# Patient Record
Sex: Female | Born: 1995 | Race: Black or African American | Hispanic: No | Marital: Single | Smoking: Never smoker
Health system: Southern US, Community
[De-identification: ages and names within clinical notes are randomized; demographics above are authoritative.]

## PROBLEM LIST (undated history)

## (undated) DIAGNOSIS — F419 Anxiety disorder, unspecified: Secondary | ICD-10-CM

---
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
## 2017-08-26 IMAGING — CR DG KNEE COMPLETE 4+V*R*
4 series · 4 of 4 positions shown · non-contrast
Comparison: None.

CLINICAL DATA: Injury with generalized right knee pain

EXAM:
RIGHT KNEE - COMPLETE 4+ VIEW

[t knee ap right]
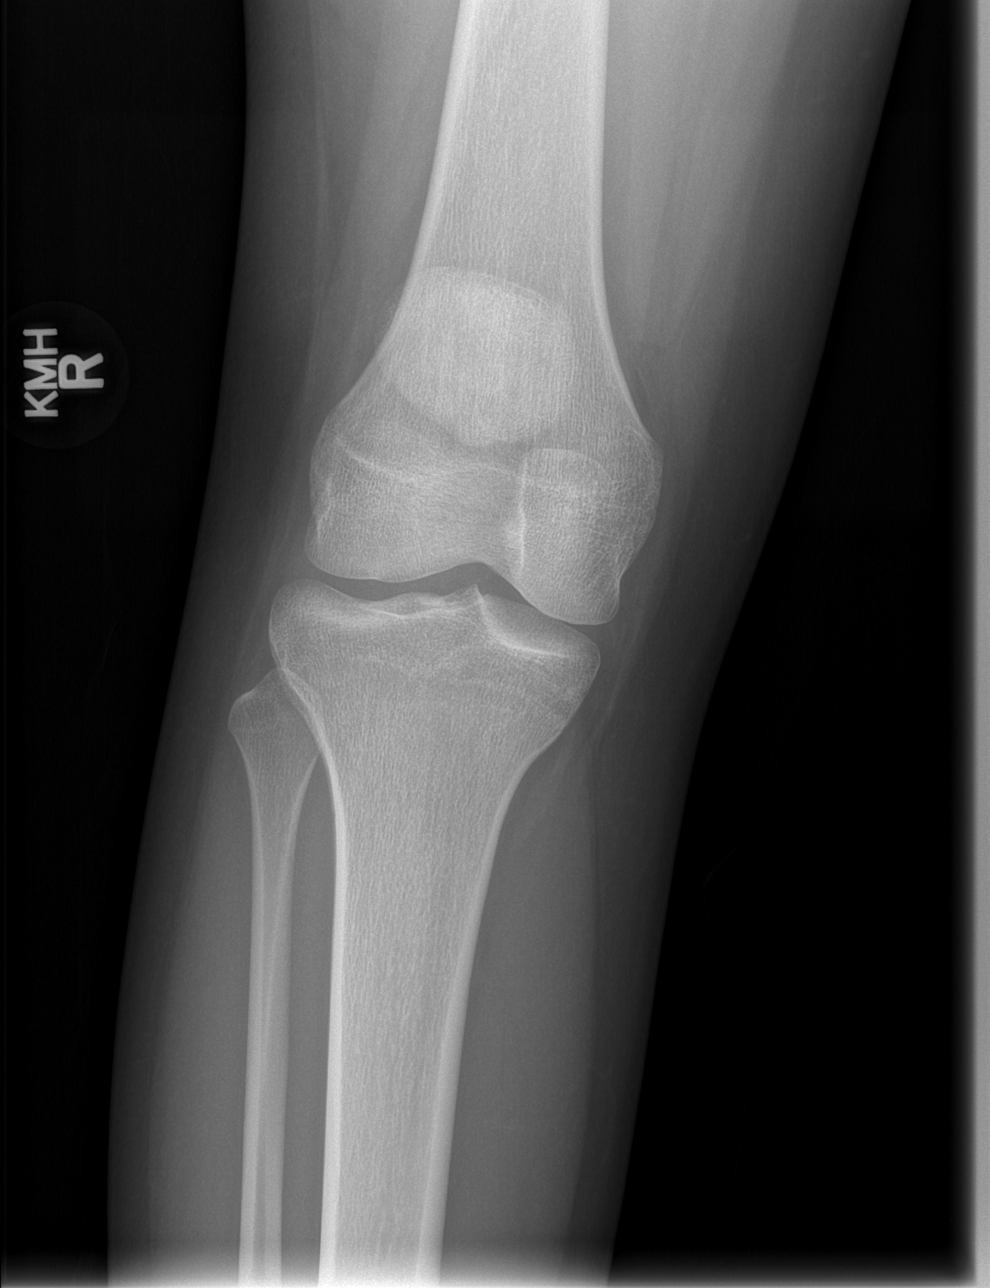

[t knee obl right (1 of 2)]
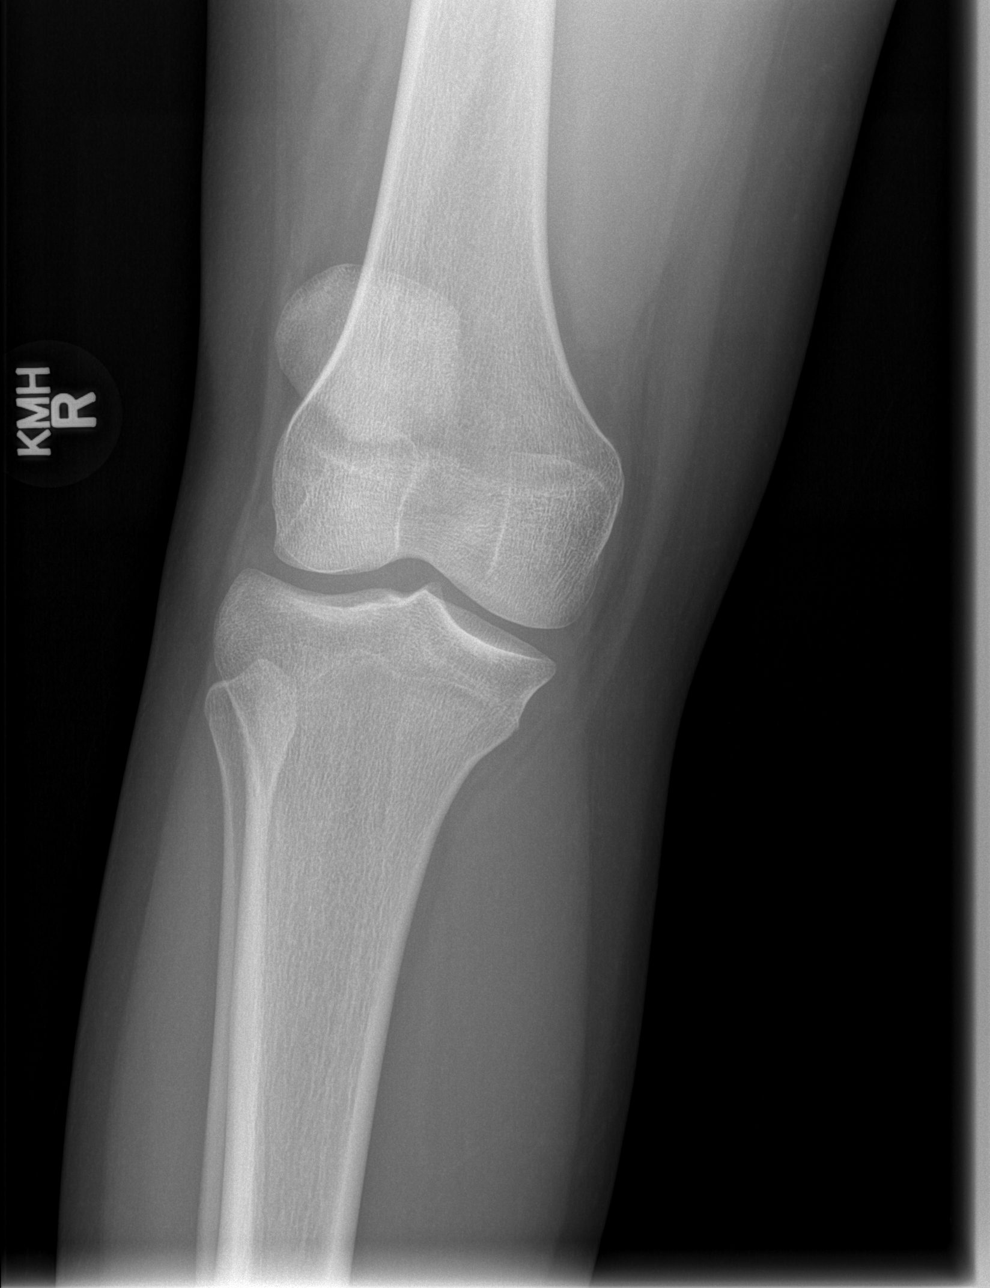

[t knee obl right (2 of 2)]
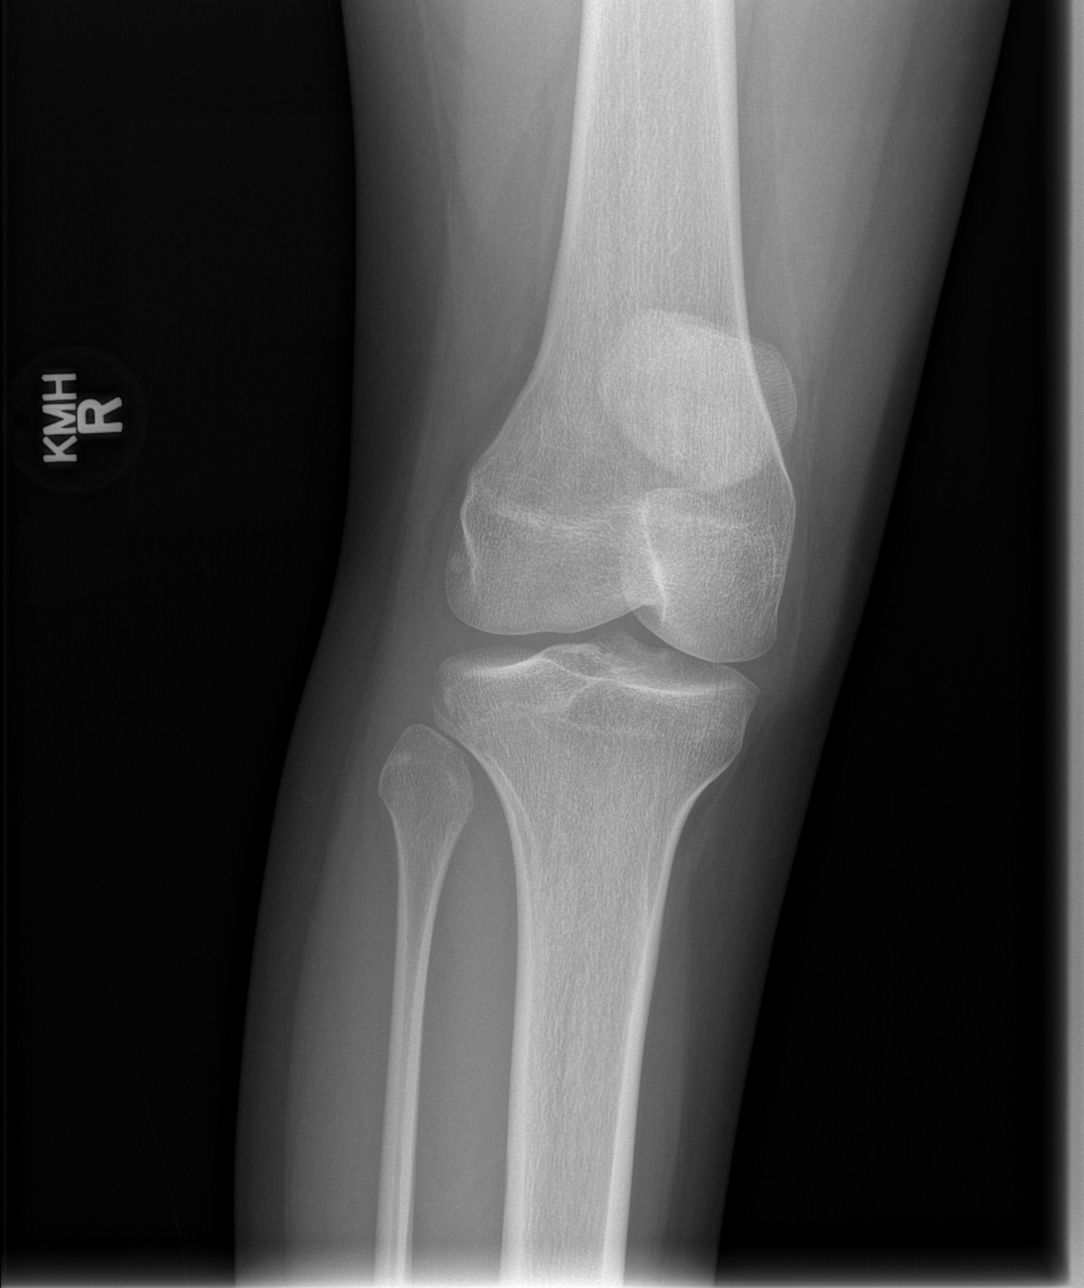

[t knee lat right]
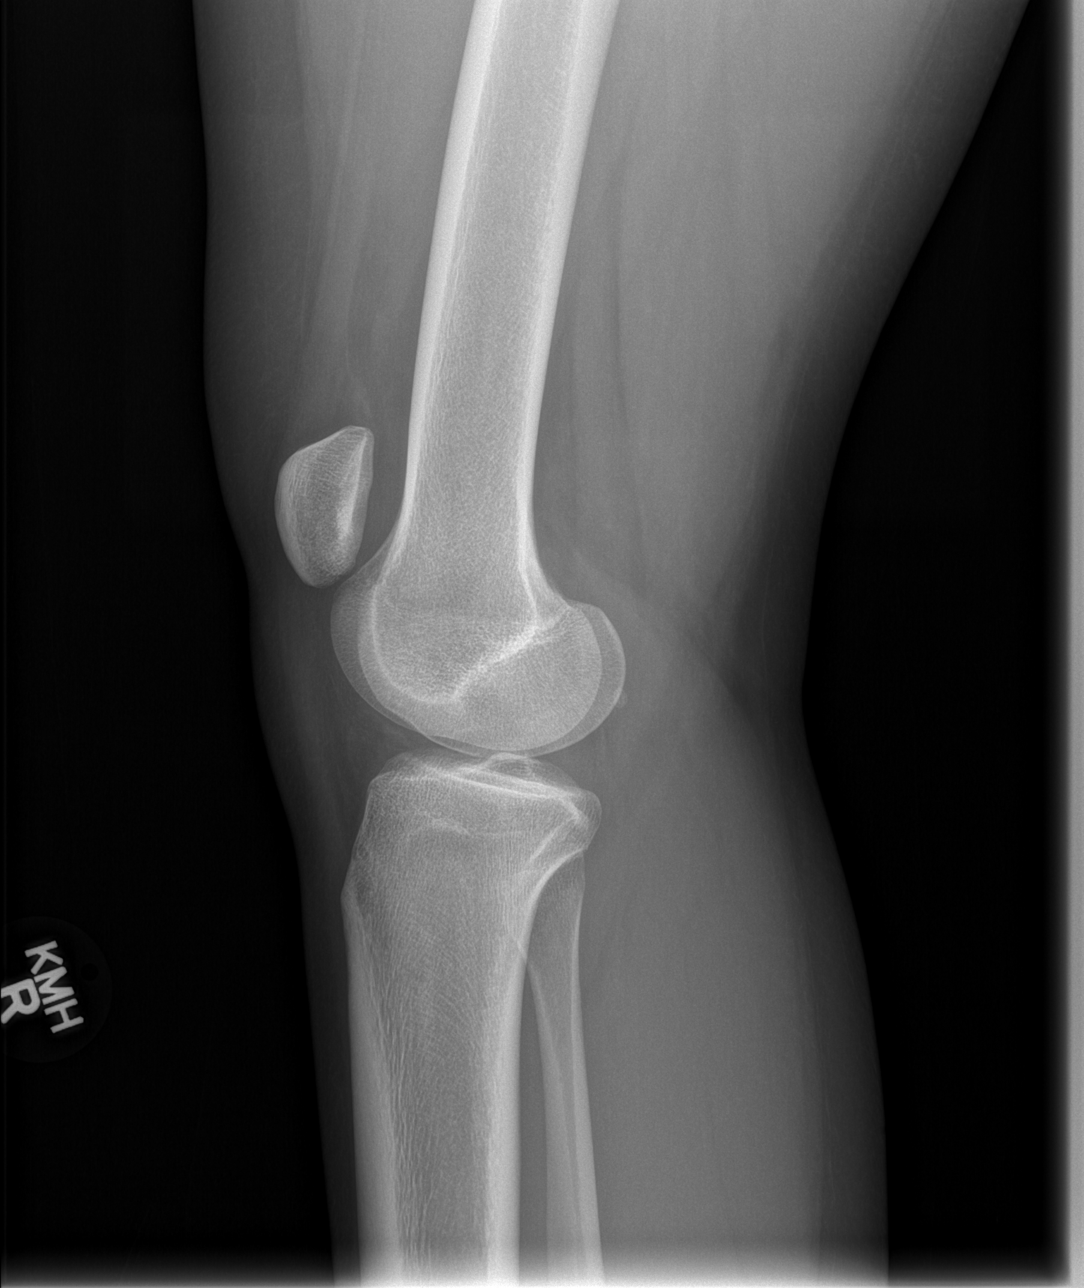

[4 of 4 positions shown; findings below may reference images not displayed]

FINDINGS: No fracture is visualized. High riding appearance of the patella.
Joint space compartments are maintained. No significant effusion.
IMPRESSION: No acute osseous abnormality.  Mildly high riding patella

## 2018-05-26 ENCOUNTER — Encounter (HOSPITAL_COMMUNITY): Payer: Self-pay | Admitting: Pharmacy Technician

## 2018-05-26 ENCOUNTER — Emergency Department (HOSPITAL_COMMUNITY)
Admission: EM | Admit: 2018-05-26 | Discharge: 2018-05-27 | Disposition: A | Discharge status: Home / Self Care | Payer: Self-pay | Attending: Emergency Medicine | Admitting: Emergency Medicine

## 2018-05-26 ENCOUNTER — Other Ambulatory Visit: Payer: Self-pay

## 2018-05-26 DIAGNOSIS — F4322 Adjustment disorder with anxiety: Secondary | ICD-10-CM | POA: Insufficient documentation

## 2018-05-26 DIAGNOSIS — Z79899 Other long term (current) drug therapy: Secondary | ICD-10-CM | POA: Insufficient documentation

## 2018-05-26 DIAGNOSIS — L739 Follicular disorder, unspecified: Secondary | ICD-10-CM | POA: Insufficient documentation

## 2018-05-26 NOTE — ED Triage Notes (Signed)
Pt reports symptoms of "herpes". Reports 'fevers, flare ups, and chills". When asked about vaginal pain pt states "some".

## 2018-05-26 NOTE — ED Notes (Signed)
Sort Nurse Note. Pt arrives with her mother, pt mother says the pt has been very paranoid and anxious, she is concerned about her mental health. The mother, Basil Dess can be reached at [phone] with disposition and questions.

## 2018-05-26 NOTE — ED Provider Notes (Signed)
[facility] EMERGENCY DEPARTMENT Provider Note   CSN: [id number] Arrival date & time: 05/26/18  2154    History   Chief Complaint Chief Complaint  Patient presents with   Fever    HPI Isabel Peterson is a 23 y.o. female.    23 year old female with a history of anxiety and depressive disorder presents to the emergency department over concern for genital herpes.  States that she has been sexually active with 4 partners in the past 6 months.  Recently engaged in unprotected sex with her current boyfriend.  She has noted bumps in her vaginal area which are uncomfortable.  She has not noted any drainage to the area nor has she taken any medications for her symptoms.  In triage, patient referencing "fevers, flare-ups, and chills".  Denies ever previously having genital herpes.  LMP 1 month ago.  Her mother expressed concern for increased anxiety and paranoia.  Patient states that she has been anxious lately, mostly surrounding the coronavirus.  Endorses travel to [place] to see her boyfriend 2 weeks ago.  Notes that 1 of his parents tested positive for corona virus, but the other tested negative.  Reports previously documented fever of 103F, but cannot recall what day she had this fever.  Denies having a fever today.  She currently has no complaints of body aches, congestion, cough, vomiting, diarrhea.  Denies any other sick contacts.  Has sporadically been followed by a therapist/psychiatrist most recently 1 week ago.  Denies any suicidal thoughts, homicidal thoughts, auditory or visual hallucinations.  The history is provided by the patient. No language interpreter was used.  Fever    Past Medical History:  Diagnosis Date   Anxiety     Patient Active Problem List   Diagnosis Date Noted   Major depressive disorder, recurrent severe without psychotic features (HCC) 04/27/2016    History reviewed. No pertinent surgical history.   OB History   No obstetric history  on file.      Home Medications    Prior to Admission medications   Medication Sig Start Date End Date Taking? Authorizing Provider  escitalopram (LEXAPRO) 10 MG tablet Take 1 tablet (10 mg total) by mouth daily. 05/02/16   Adonis Brook, NP  hydrOXYzine (ATARAX/VISTARIL) 25 MG tablet Take 1 tablet (25 mg total) by mouth every 6 (six) hours as needed for anxiety. 05/01/16   Adonis Brook, NP  traZODone (DESYREL) 100 MG tablet Take 1 tablet (100 mg total) by mouth at bedtime as needed for sleep. 05/01/16   Adonis Brook, NP    Family History No family history on file.  Social History Social History   Tobacco Use   Smoking status: Never Smoker   Smokeless tobacco: Never Used  Substance Use Topics   Alcohol use: No   Drug use: No     Allergies   Patient has no known allergies.   Review of Systems Review of Systems  Constitutional: Positive for fever.  Ten systems reviewed and are negative for acute change, except as noted in the HPI.    Physical Exam Updated Vital Signs BP (!) 127/93    Pulse (!) 108    Temp 98.6 F (37 C) (Oral)    Resp 16    Ht 5\' 4"  (1.626 m)    Wt 61.2 kg    SpO2 100%    BMI 23.17 kg/m   Physical Exam Vitals signs and nursing note reviewed.  Constitutional:      General: She is  not in acute distress.    Appearance: She is well-developed. She is not diaphoretic.     Comments: Nontoxic appearing, in NAD  HENT:     Head: Normocephalic and atraumatic.  Eyes:     General: No scleral icterus.    Conjunctiva/sclera: Conjunctivae normal.  Neck:     Musculoskeletal: Normal range of motion.  Cardiovascular:     Rate and Rhythm: Normal rate and regular rhythm.     Pulses: Normal pulses.  Pulmonary:     Effort: Pulmonary effort is normal. No respiratory distress.     Breath sounds: No stridor. No wheezing, rhonchi or rales.     Comments: No cough. Lungs CTAB. Respirations even and unlabored. Genitourinary:    Labia:        Right: No  tenderness or injury.        Left: No tenderness or injury.      Vagina: Vaginal discharge present.     Cervix: No cervical motion tenderness, lesion or cervical bleeding.     Comments: Cluster of 5 papules associated with hair follicles to the lower portion of the right labia majora. No vesicles, purulence, drainage. White vaginal discharge noted at the vaginal introitus.  Musculoskeletal: Normal range of motion.  Skin:    General: Skin is warm and dry.     Coloration: Skin is not pale.     Findings: No erythema or rash.  Neurological:     Mental Status: She is alert and oriented to person, place, and time.     Coordination: Coordination normal.  Psychiatric:        Mood and Affect: Mood is anxious.        Behavior: Behavior is withdrawn. Behavior is cooperative.      ED Treatments / Results  Labs (all labs ordered are listed, but only abnormal results are displayed) Labs Reviewed  WET PREP, GENITAL - Abnormal; Notable for the following components:      Result Value   Clue Cells Wet Prep HPF POC PRESENT (*)    WBC, Wet Prep HPF POC MODERATE (*)    All other components within normal limits  URINALYSIS, ROUTINE W REFLEX MICROSCOPIC - Abnormal; Notable for the following components:   Color, Urine AMBER (*)    APPearance CLOUDY (*)    Specific Gravity, Urine 1.031 (*)    Ketones, ur 20 (*)    Protein, ur 100 (*)    Bacteria, UA FEW (*)    All other components within normal limits  PREGNANCY, URINE  HSV 1 ANTIBODY, IGG  HSV 2 ANTIBODY, IGG  GC/CHLAMYDIA PROBE AMP ([place]) NOT AT [facility]    EKG None  Radiology No results found.  Procedures Procedures (including critical care time)  Medications Ordered in ED Medications - No data to display   Initial Impression / Assessment and Plan / ED Course  I have reviewed the triage vital signs and the nursing notes.  Pertinent labs & imaging results that were available during my care of the patient were reviewed by me and  considered in my medical decision making (see chart for details).        23 year old female presenting to the ED for complaint of genital bumps.  Physical exam consistent with folliculitis secondary to shaving as papules are associated with individual hair follicles.  She has no evidence of vesicles.  Low suspicion for genital herpes, though she did undergo full STD panel which is currently pending.  Patient also expressing thoughts of  increased anxiety and paranoia surrounding coronavirus.  Traveled to [place] 2 weeks ago and reports that her boyfriend's parent tested positive.  Endorses remote history of fever, but is unsure of when this fever was.  She has not been febrile in the past 24 hours and denies any associated symptoms including body aches, congestion, cough, nausea, vomiting.  Clinically, she appears well.    In light of the current and rapidly developing COVID-19 pandemic, I have attempted to carefully consider the risks and benefits of prolonged ED workups and/or hospitalization versus patient's risk of acquiring or transmitting SARS-CoV-2.  I have made reasonable efforts to conserve healthcare resources and defer to safe outpatient alternatives when practical.  I have also discussed the importance of social distancing and proper hygiene to the patient and/or caretakers.  With respect to her angst and paranoia, the patient denies SI/HI and is not reacting to internal stimuli.  I do not believe her to be a harm to herself or others.  She will be discharge with plan to travel home to [place] with her mother.  Return precautions discussed and provided. Patient discharged in stable condition with no unaddressed concerns.   Final Clinical Impressions(s) / ED Diagnoses   Final diagnoses:  Folliculitis  Adjustment disorder with anxious mood    ED Discharge Orders    None       Antony Madura, PA-C 05/27/18 0217    Gwyneth Sprout, MD 05/29/18 1018

## 2018-05-27 LAB — PREGNANCY, URINE: PREG TEST UR: NEGATIVE

## 2018-05-27 LAB — URINALYSIS, ROUTINE W REFLEX MICROSCOPIC
Bilirubin Urine: NEGATIVE
Glucose, UA: NEGATIVE mg/dL
Hgb urine dipstick: NEGATIVE
Ketones, ur: 20 mg/dL — AB
Leukocytes,Ua: NEGATIVE
Nitrite: NEGATIVE
Protein, ur: 100 mg/dL — AB
Specific Gravity, Urine: 1.031 — ABNORMAL HIGH (ref 1.005–1.030)
pH: 5 (ref 5.0–8.0)

## 2018-05-27 LAB — GC/CHLAMYDIA PROBE AMP (~~LOC~~) NOT AT ARMC
Chlamydia: NEGATIVE
Neisseria Gonorrhea: NEGATIVE

## 2018-05-27 LAB — WET PREP, GENITAL
Sperm: NONE SEEN
TRICH WET PREP: NONE SEEN
Yeast Wet Prep HPF POC: NONE SEEN

## 2018-05-27 NOTE — Discharge Instructions (Signed)
Your genital bumps appear consistent with folliculitis from shaving. You do have STD tests and a herpes screen pending. Follow-up with the health department in 48 hours for the results of your STD tests. If you test positive for STDs, notify all sexual partners of their need to be tested and treated as well. Do not engage in sexual intercourse for one week after STD treatment, if treatment is indicated. Use a condom when sexually active.  We also recommend follow-up with a primary care doctor and/your therapist or psychiatrist regarding your increased anxiety.  You may return to the ED for new or concerning symptoms.

## 2018-05-28 LAB — HSV 2 ANTIBODY, IGG

## 2018-05-28 LAB — HSV 1 ANTIBODY, IGG: HSV 1 Glycoprotein G Ab, IgG: 31.1 index — ABNORMAL HIGH (ref 0.00–0.90)

## 2018-12-02 ENCOUNTER — Other Ambulatory Visit: Payer: Self-pay

## 2018-12-02 ENCOUNTER — Emergency Department (HOSPITAL_COMMUNITY)
Admission: EM | Admit: 2018-12-02 | Discharge: 2018-12-03 | Disposition: A | Discharge status: Home / Self Care | Payer: BLUE CROSS/BLUE SHIELD | Attending: Emergency Medicine | Admitting: Emergency Medicine

## 2018-12-02 ENCOUNTER — Encounter (HOSPITAL_COMMUNITY): Payer: Self-pay

## 2018-12-02 DIAGNOSIS — N939 Abnormal uterine and vaginal bleeding, unspecified: Secondary | ICD-10-CM | POA: Diagnosis not present

## 2018-12-02 DIAGNOSIS — R102 Pelvic and perineal pain: Secondary | ICD-10-CM | POA: Insufficient documentation

## 2018-12-02 LAB — URINALYSIS, ROUTINE W REFLEX MICROSCOPIC
Bilirubin Urine: NEGATIVE
Glucose, UA: NEGATIVE mg/dL
Ketones, ur: 20 mg/dL — AB
Nitrite: NEGATIVE
Protein, ur: 30 mg/dL — AB
Specific Gravity, Urine: 1.029 (ref 1.005–1.030)
pH: 5 (ref 5.0–8.0)

## 2018-12-02 LAB — CBC
HCT: 40.5 % (ref 36.0–46.0)
Hemoglobin: 13.5 g/dL (ref 12.0–15.0)
MCH: 29.2 pg (ref 26.0–34.0)
MCHC: 33.3 g/dL (ref 30.0–36.0)
MCV: 87.7 fL (ref 80.0–100.0)
Platelets: 347 10*3/uL (ref 150–400)
RBC: 4.62 MIL/uL (ref 3.87–5.11)
RDW: 12.6 % (ref 11.5–15.5)
WBC: 4.7 10*3/uL (ref 4.0–10.5)
nRBC: 0 % (ref 0.0–0.2)

## 2018-12-02 LAB — COMPREHENSIVE METABOLIC PANEL
ALT: 21 U/L (ref 0–44)
AST: 18 U/L (ref 15–41)
Albumin: 4.4 g/dL (ref 3.5–5.0)
Alkaline Phosphatase: 50 U/L (ref 38–126)
Anion gap: 10 (ref 5–15)
BUN: 8 mg/dL (ref 6–20)
CO2: 21 mmol/L — ABNORMAL LOW (ref 22–32)
Calcium: 9.6 mg/dL (ref 8.9–10.3)
Chloride: 107 mmol/L (ref 98–111)
Creatinine, Ser: 0.77 mg/dL (ref 0.44–1.00)
GFR calc Af Amer: >60 mL/min (ref >60)
GFR calc non Af Amer: >60 mL/min (ref >60)
Glucose, Bld: 102 mg/dL — ABNORMAL HIGH (ref 70–99)
Potassium: 3.5 mmol/L (ref 3.5–5.1)
Sodium: 138 mmol/L (ref 135–145)
Total Bilirubin: 0.7 mg/dL (ref 0.3–1.2)
Total Protein: 7.7 g/dL (ref 6.5–8.1)

## 2018-12-02 LAB — TYPE AND SCREEN
ABO/RH(D): O POS
Antibody Screen: NEGATIVE

## 2018-12-02 LAB — LIPASE, BLOOD: Lipase: 35 U/L (ref 11–51)

## 2018-12-02 LAB — I-STAT BETA HCG BLOOD, ED (MC, WL, AP ONLY): I-stat hCG, quantitative: <5 m[IU]/mL (ref <5)

## 2018-12-02 LAB — ABO/RH: ABO/RH(D): O POS

## 2018-12-02 MED ORDER — SODIUM CHLORIDE 0.9% FLUSH: 3.0000 mL | Freq: Once | INTRAVENOUS | Status: DC

## 2018-12-02 NOTE — ED Triage Notes (Signed)
Pt reports heavy vaginal bleeding since yesterday along with lower abd cramping. Changed her pad 3 times today

## 2018-12-03 LAB — WET PREP, GENITAL
Clue Cells Wet Prep HPF POC: NONE SEEN
Sperm: NONE SEEN
Trich, Wet Prep: NONE SEEN
Yeast Wet Prep HPF POC: NONE SEEN

## 2018-12-03 MED ORDER — IBUPROFEN 600 MG PO TABS: 600.0000 mg | ORAL_TABLET | Freq: Four times a day (QID) | ORAL | 0 refills | Status: AC | PRN

## 2018-12-03 MED ORDER — KETOROLAC TROMETHAMINE 60 MG/2ML IM SOLN
30.0000 mg | Freq: Once | INTRAMUSCULAR | Status: AC
  Administered 2018-12-03: 30 mg via INTRAMUSCULAR
  Filled 2018-12-03: qty 2

## 2018-12-03 NOTE — ED Notes (Signed)
Patient bf called and stated his gf was bleeding profusely and needed to be checked on. Reassured pt that we have labwork done and vitals rechecked and within normal limits. Updated on wait time

## 2018-12-03 NOTE — ED Provider Notes (Signed)
[facility] EMERGENCY DEPARTMENT Provider Note   CSN: [id number] Arrival date & time: 12/02/18  1824     History   Chief Complaint Chief Complaint  Patient presents with  . Vaginal Bleeding  . Abdominal Pain    HPI Isabel Peterson is a 23 y.o. female history of anxiety who presents to the emergency department with a chief complaint of vaginal bleeding.  The patient reports that she receives Depakote shot.  Last injection was August 2.  She reports that she has been spotting for the last 3 weeks, but has had steady vaginal bleeding for the last 3 days.  She reports that she uses a medium absorbency tampons and has had to change her tampons approximately 5-6 times per day.  She reports that earlier tonight she developed lower abdominal cramping that has been constant, but waxing and waning in intensity.  She denies dysuria, urinary frequency or hesitancy, nausea, vomiting, diarrhea, constipation, syncope, shortness of breath, chest pain, pallor.  She did not take anything for symptoms prior to arrival.  She is sexually active with one female partner.  They do not use protection.  Low suspicion for STIs at this time.     The history is provided by the patient. No language interpreter was used.    Past Medical History:  Diagnosis Date  . Anxiety     Patient Active Problem List   Diagnosis Date Noted  . 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 No pertinent surgical history.   OB History   No obstetric history on file.      Home Medications    Prior to Admission medications   Medication Sig Start Date End Date Taking? Authorizing Provider  ibuprofen (ADVIL) 600 MG tablet Take 1 tablet (600 mg total) by mouth every 6 (six) hours as needed. 12/03/18   Aadon Gorelik A, PA-C  escitalopram (LEXAPRO) 10 MG tablet Take 1 tablet (10 mg total) by mouth daily. Patient not taking: Reported on 12/03/2018  05/02/16 12/03/18  Adonis Brook, NP  traZODone (DESYREL) 100 MG tablet Take 1 tablet (100 mg total) by mouth at bedtime as needed for sleep. Patient not taking: Reported on 12/03/2018 05/01/16 12/03/18  Adonis Brook, NP    Family History No family history on file.  Social History Social History   Tobacco Use  . Smoking status: Never Smoker  . Smokeless tobacco: Never Used  Substance Use Topics  . Alcohol use: No  . Drug use: No     Allergies   Patient has no known allergies.   Review of Systems Review of Systems  Constitutional: Negative for activity change, chills and fever.  Respiratory: Negative for shortness of breath.   Cardiovascular: Negative for chest pain.  Gastrointestinal: Negative for abdominal pain, diarrhea, nausea and vomiting.  Genitourinary: Positive for pelvic pain and vaginal bleeding. Negative for dysuria, enuresis, flank pain, frequency, genital sores, hematuria, urgency, vaginal discharge and vaginal pain.  Musculoskeletal: Negative for back pain.  Skin: Negative for rash.  Allergic/Immunologic: Negative for immunocompromised state.  Neurological: Negative for dizziness, seizures, weakness, numbness and headaches.  Psychiatric/Behavioral: Negative for confusion.   Physical Exam Updated Vital Signs BP 122/61 (BP Location: Right Arm)   Pulse 79   Temp 98.2 F (36.8 C) (Oral)   Resp 16   SpO2 96%   Physical Exam Vitals signs and nursing note reviewed. Exam conducted with a chaperone present.  Constitutional:  General: She is not in acute distress. HENT:     Head: Normocephalic.  Eyes:     Conjunctiva/sclera: Conjunctivae normal.  Neck:     Musculoskeletal: Neck supple.  Cardiovascular:     Rate and Rhythm: Normal rate and regular rhythm.     Heart sounds: No murmur. No friction rub. No gallop.   Pulmonary:     Effort: Pulmonary effort is normal. No respiratory distress.     Breath sounds: No stridor. No wheezing, rhonchi or rales.   Chest:     Chest wall: No tenderness.  Abdominal:     General: There is no distension.     Palpations: Abdomen is soft. There is no mass.     Tenderness: There is abdominal tenderness. There is no right CVA tenderness, left CVA tenderness, guarding or rebound.     Hernia: No hernia is present.     Comments: Mild bilateral pelvic tenderness to palpation without rebound or guarding.  Abdomen is soft and nondistended.  No tenderness over McBurney's point.  No CVA tenderness bilaterally.  Normoactive bowel sounds in all 4 quadrants.  Genitourinary:    Comments: Minimal blood noted in the vaginal vault.  No cervical motion tenderness.  No vaginal lacerations or wounds.  No adnexal tenderness or fullness bilaterally. Skin:    General: Skin is warm.     Findings: No rash.  Neurological:     Mental Status: She is alert.  Psychiatric:        Behavior: Behavior normal.    ED Treatments / Results  Labs (all labs ordered are listed, but only abnormal results are displayed) Labs Reviewed  WET PREP, GENITAL - Abnormal; Notable for the following components:      Result Value   WBC, Wet Prep HPF POC MODERATE (*)    All other components within normal limits  COMPREHENSIVE METABOLIC PANEL - Abnormal; Notable for the following components:   CO2 21 (*)    Glucose, Bld 102 (*)    All other components within normal limits  URINALYSIS, ROUTINE W REFLEX MICROSCOPIC - Abnormal; Notable for the following components:   APPearance HAZY (*)    Hgb urine dipstick LARGE (*)    Ketones, ur 20 (*)    Protein, ur 30 (*)    Leukocytes,Ua TRACE (*)    Bacteria, UA RARE (*)    All other components within normal limits  LIPASE, BLOOD  CBC  I-STAT BETA HCG BLOOD, ED ([facility], [facility], [facility] ONLY)  TYPE AND SCREEN  ABO/RH  GC/CHLAMYDIA PROBE AMP ([place]) NOT AT [facility]    EKG None  Radiology No results found.  Procedures Procedures (including critical care time)  Medications Ordered in ED Medications  sodium  chloride flush (NS) 0.9 % injection 3 mL (has no administration in time range)  ketorolac (TORADOL) injection 30 mg (30 mg Intramuscular Given 12/03/18 0507)     Initial Impression / Assessment and Plan / ED Course  I have reviewed the triage vital signs and the nursing notes.  Pertinent labs & imaging results that were available during my care of the patient were reviewed by me and considered in my medical decision making (see chart for details).        23 year old female with a history of anxiety presenting with vaginal spotting for the last 3 weeks with constant vaginal bleeding for the last 3 days.  She has been wearing medium absorbency tampons and has to change them approximately 5-6 times daily.  She  is on the Depakote shot for birth control.  She has developed bilateral pelvic pain since earlier tonight.  No treatment prior to arrival.  Hemoglobin is normal.  Pregnancy test is negative.  Urinalysis is not concerning for infection.  Pelvic exam is grossly unremarkable.  Labs are otherwise unremarkable.  She was given Toradol and reported significant improvement in her symptoms.  Low suspicion for ectopic pregnancy, spontaneous abortion, symptomatic anemia, ruptured ovarian cyst, or ovarian torsion.  Will discharge patient with ibuprofen and OB/GYN follow-up if bleeding persist.  She was given return precautions the ER.  She is hemodynamically stable in no acute distress.  Safe for discharge home with outpatient OB/GYN follow-up as needed.  Final Clinical Impressions(s) / ED Diagnoses   Final diagnoses:  Vaginal bleeding    ED Discharge Orders         Ordered    ibuprofen (ADVIL) 600 MG tablet  Every 6 hours PRN     12/03/18 0632           Frederik PearMcDonald, Tivis Wherry A, PA-C 12/03/18 [id number]    Glynn Octaveancour, Stephen, MD 12/03/18 [id number]

## 2018-12-03 NOTE — Discharge Instructions (Addendum)
Thank you for allowing me to care for you today in the Emergency Department.   Take 600 mg of ibuprofen with food every 6 hours as needed for pain control.  You can also alternate this with 650 mg of Tylenol every 6 hours.  Do not drink alcohol if you are taking Tylenol.  Call to schedule follow-up appointment with OB/GYN if bleeding persist for more than 3 to 4 days.  Return to the emergency department if you develop significantly worsening vaginal bleeding accompanied by shortness of breath, if you pass out, high fevers, or other new, concerning symptoms.

## 2018-12-06 LAB — GC/CHLAMYDIA PROBE AMP (~~LOC~~) NOT AT ARMC
Chlamydia: NEGATIVE
Neisseria Gonorrhea: NEGATIVE

## 2018-12-29 ENCOUNTER — Ambulatory Visit: Payer: BLUE CROSS/BLUE SHIELD | Admitting: Obstetrics & Gynecology

## 2018-12-29 DIAGNOSIS — Z01419 Encounter for gynecological examination (general) (routine) without abnormal findings: Secondary | ICD-10-CM
